# Patient Record
Sex: Female | Born: 1980 | Race: Black or African American | Hispanic: No | Marital: Single | Smoking: Current every day smoker
Health system: Southern US, Community
[De-identification: ages and names within clinical notes are randomized; demographics above are authoritative.]

---
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
## 2001-01-31 ENCOUNTER — Inpatient Hospital Stay (HOSPITAL_COMMUNITY): Admission: AD | Admit: 2001-01-31 | Discharge: 2001-01-31 | Payer: Self-pay | Admitting: Obstetrics and Gynecology

## 2001-02-20 ENCOUNTER — Inpatient Hospital Stay (HOSPITAL_COMMUNITY): Admission: AD | Admit: 2001-02-20 | Discharge: 2001-02-20 | Payer: Self-pay | Admitting: Obstetrics and Gynecology

## 2001-03-17 ENCOUNTER — Inpatient Hospital Stay (HOSPITAL_COMMUNITY): Admission: AD | Admit: 2001-03-17 | Discharge: 2001-03-17 | Payer: Self-pay | Admitting: Obstetrics and Gynecology

## 2001-03-31 ENCOUNTER — Observation Stay (HOSPITAL_COMMUNITY): Admission: AD | Admit: 2001-03-31 | Discharge: 2001-03-31 | Payer: Self-pay | Admitting: Obstetrics and Gynecology

## 2001-04-08 ENCOUNTER — Encounter (INDEPENDENT_AMBULATORY_CARE_PROVIDER_SITE_OTHER): Payer: Self-pay | Admitting: Specialist

## 2001-04-08 ENCOUNTER — Inpatient Hospital Stay (HOSPITAL_COMMUNITY): Admission: AD | Admit: 2001-04-08 | Discharge: 2001-04-11 | Payer: Self-pay | Admitting: Obstetrics and Gynecology

## 2001-05-21 ENCOUNTER — Other Ambulatory Visit: Admission: RE | Admit: 2001-05-21 | Discharge: 2001-05-21 | Payer: Self-pay | Admitting: Obstetrics and Gynecology

## 2001-06-28 ENCOUNTER — Emergency Department (HOSPITAL_COMMUNITY): Admission: EM | Admit: 2001-06-28 | Discharge: 2001-06-28 | Payer: Self-pay

## 2001-07-08 ENCOUNTER — Emergency Department (HOSPITAL_COMMUNITY): Admission: EM | Admit: 2001-07-08 | Discharge: 2001-07-08 | Payer: Self-pay | Admitting: Emergency Medicine

## 2001-07-08 ENCOUNTER — Encounter: Payer: Self-pay | Admitting: Emergency Medicine

## 2001-10-28 ENCOUNTER — Inpatient Hospital Stay (HOSPITAL_COMMUNITY): Admission: AD | Admit: 2001-10-28 | Discharge: 2001-10-28 | Payer: Self-pay | Admitting: *Deleted

## 2001-10-31 ENCOUNTER — Inpatient Hospital Stay (HOSPITAL_COMMUNITY): Admission: AD | Admit: 2001-10-31 | Discharge: 2001-10-31 | Payer: Self-pay | Admitting: *Deleted

## 2001-11-07 ENCOUNTER — Encounter: Admission: RE | Admit: 2001-11-07 | Discharge: 2001-11-07 | Payer: Self-pay | Admitting: *Deleted

## 2001-11-11 ENCOUNTER — Inpatient Hospital Stay (HOSPITAL_COMMUNITY): Admission: AD | Admit: 2001-11-11 | Discharge: 2001-11-11 | Payer: Self-pay | Admitting: Obstetrics and Gynecology

## 2001-11-14 ENCOUNTER — Encounter: Admission: RE | Admit: 2001-11-14 | Discharge: 2001-11-14 | Payer: Self-pay | Admitting: *Deleted

## 2001-11-19 ENCOUNTER — Encounter (INDEPENDENT_AMBULATORY_CARE_PROVIDER_SITE_OTHER): Payer: Self-pay

## 2001-11-19 ENCOUNTER — Inpatient Hospital Stay (HOSPITAL_COMMUNITY): Admission: AD | Admit: 2001-11-19 | Discharge: 2001-11-21 | Payer: Self-pay | Admitting: *Deleted

## 2002-08-13 ENCOUNTER — Encounter: Payer: Self-pay | Admitting: *Deleted

## 2002-08-13 ENCOUNTER — Emergency Department (HOSPITAL_COMMUNITY): Admission: EM | Admit: 2002-08-13 | Discharge: 2002-08-13 | Payer: Self-pay | Admitting: *Deleted

## 2002-12-16 ENCOUNTER — Inpatient Hospital Stay (HOSPITAL_COMMUNITY): Admission: AD | Admit: 2002-12-16 | Discharge: 2002-12-16 | Payer: Self-pay | Admitting: *Deleted

## 2002-12-16 ENCOUNTER — Encounter: Payer: Self-pay | Admitting: *Deleted

## 2003-02-04 ENCOUNTER — Encounter: Admission: RE | Admit: 2003-02-04 | Discharge: 2003-02-04 | Payer: Self-pay | Admitting: *Deleted

## 2003-02-05 ENCOUNTER — Encounter: Admission: RE | Admit: 2003-02-05 | Discharge: 2003-02-05 | Payer: Self-pay | Admitting: *Deleted

## 2003-02-11 ENCOUNTER — Ambulatory Visit (HOSPITAL_COMMUNITY): Admission: RE | Admit: 2003-02-11 | Discharge: 2003-02-11 | Payer: Self-pay | Admitting: *Deleted

## 2003-02-12 ENCOUNTER — Encounter: Admission: RE | Admit: 2003-02-12 | Discharge: 2003-02-12 | Payer: Self-pay | Admitting: Family Medicine

## 2003-02-23 ENCOUNTER — Inpatient Hospital Stay (HOSPITAL_COMMUNITY): Admission: AD | Admit: 2003-02-23 | Discharge: 2003-02-23 | Payer: Self-pay | Admitting: Obstetrics & Gynecology

## 2003-03-22 ENCOUNTER — Inpatient Hospital Stay (HOSPITAL_COMMUNITY): Admission: AD | Admit: 2003-03-22 | Discharge: 2003-03-24 | Payer: Self-pay | Admitting: Family Medicine

## 2003-04-21 ENCOUNTER — Encounter: Admission: RE | Admit: 2003-04-21 | Discharge: 2003-04-21 | Payer: Self-pay | Admitting: Obstetrics and Gynecology

## 2003-05-14 ENCOUNTER — Emergency Department (HOSPITAL_COMMUNITY): Admission: AD | Admit: 2003-05-14 | Discharge: 2003-05-14 | Payer: Self-pay | Admitting: Family Medicine

## 2004-03-21 ENCOUNTER — Emergency Department (HOSPITAL_COMMUNITY): Admission: EM | Admit: 2004-03-21 | Discharge: 2004-03-21 | Payer: Self-pay | Admitting: Emergency Medicine

## 2004-04-17 ENCOUNTER — Emergency Department (HOSPITAL_COMMUNITY): Admission: EM | Admit: 2004-04-17 | Discharge: 2004-04-17 | Payer: Self-pay | Admitting: Emergency Medicine

## 2004-04-26 ENCOUNTER — Emergency Department (HOSPITAL_COMMUNITY): Admission: EM | Admit: 2004-04-26 | Discharge: 2004-04-26 | Payer: Self-pay | Admitting: Emergency Medicine

## 2004-05-30 ENCOUNTER — Inpatient Hospital Stay (HOSPITAL_COMMUNITY): Admission: AD | Admit: 2004-05-30 | Discharge: 2004-05-30 | Payer: Self-pay | Admitting: Obstetrics & Gynecology

## 2004-06-23 ENCOUNTER — Ambulatory Visit: Payer: Self-pay | Admitting: Family Medicine

## 2004-07-07 ENCOUNTER — Ambulatory Visit: Payer: Self-pay | Admitting: Family Medicine

## 2004-08-02 ENCOUNTER — Inpatient Hospital Stay (HOSPITAL_COMMUNITY): Admission: AD | Admit: 2004-08-02 | Discharge: 2004-08-04 | Payer: Self-pay | Admitting: Family Medicine

## 2004-08-03 ENCOUNTER — Ambulatory Visit: Payer: Self-pay | Admitting: Obstetrics and Gynecology

## 2004-08-03 ENCOUNTER — Encounter (INDEPENDENT_AMBULATORY_CARE_PROVIDER_SITE_OTHER): Payer: Self-pay | Admitting: Specialist

## 2004-09-16 ENCOUNTER — Emergency Department (HOSPITAL_COMMUNITY): Admission: EM | Admit: 2004-09-16 | Discharge: 2004-09-16 | Payer: Self-pay | Admitting: Emergency Medicine

## 2005-02-05 ENCOUNTER — Emergency Department (HOSPITAL_COMMUNITY): Admission: EM | Admit: 2005-02-05 | Discharge: 2005-02-05 | Payer: Self-pay | Admitting: Emergency Medicine

## 2006-01-31 ENCOUNTER — Emergency Department (HOSPITAL_COMMUNITY): Admission: EM | Admit: 2006-01-31 | Discharge: 2006-01-31 | Payer: Self-pay | Admitting: Emergency Medicine

## 2006-04-01 ENCOUNTER — Emergency Department (HOSPITAL_COMMUNITY): Admission: EM | Admit: 2006-04-01 | Discharge: 2006-04-02 | Payer: Self-pay | Admitting: *Deleted

## 2006-12-03 ENCOUNTER — Emergency Department (HOSPITAL_COMMUNITY): Admission: EM | Admit: 2006-12-03 | Discharge: 2006-12-03 | Payer: Self-pay | Admitting: Emergency Medicine

## 2007-12-01 ENCOUNTER — Emergency Department (HOSPITAL_COMMUNITY): Admission: EM | Admit: 2007-12-01 | Discharge: 2007-12-01 | Payer: Self-pay | Admitting: Emergency Medicine

## 2008-05-06 ENCOUNTER — Emergency Department (HOSPITAL_COMMUNITY): Admission: EM | Admit: 2008-05-06 | Discharge: 2008-05-06 | Payer: Self-pay | Admitting: Emergency Medicine

## 2008-05-08 ENCOUNTER — Emergency Department (HOSPITAL_COMMUNITY): Admission: EM | Admit: 2008-05-08 | Discharge: 2008-05-08 | Payer: Self-pay | Admitting: Emergency Medicine

## 2008-08-06 ENCOUNTER — Emergency Department (HOSPITAL_COMMUNITY): Admission: EM | Admit: 2008-08-06 | Discharge: 2008-08-06 | Payer: Self-pay | Admitting: Emergency Medicine

## 2008-08-15 ENCOUNTER — Emergency Department (HOSPITAL_COMMUNITY): Admission: EM | Admit: 2008-08-15 | Discharge: 2008-08-15 | Payer: Self-pay | Admitting: Emergency Medicine

## 2009-01-05 ENCOUNTER — Emergency Department (HOSPITAL_COMMUNITY): Admission: EM | Admit: 2009-01-05 | Discharge: 2009-01-05 | Payer: Self-pay | Admitting: Emergency Medicine

## 2009-05-29 ENCOUNTER — Emergency Department (HOSPITAL_COMMUNITY): Admission: EM | Admit: 2009-05-29 | Discharge: 2009-05-29 | Payer: Self-pay | Admitting: Emergency Medicine

## 2009-11-27 ENCOUNTER — Ambulatory Visit: Payer: Self-pay | Admitting: Nurse Practitioner

## 2009-11-27 ENCOUNTER — Inpatient Hospital Stay (HOSPITAL_COMMUNITY): Admission: AD | Admit: 2009-11-27 | Discharge: 2009-11-27 | Payer: Self-pay | Admitting: Obstetrics & Gynecology
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
## 2010-08-28 LAB — URINE MICROSCOPIC-ADD ON

## 2010-08-28 LAB — URINALYSIS, ROUTINE W REFLEX MICROSCOPIC
Ketones, ur: 15 mg/dL — AB
Nitrite: POSITIVE — AB
Protein, ur: >300 mg/dL — AB
pH: 6.5 (ref 5.0–8.0)

## 2010-09-12 LAB — ETHANOL
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
## 2011-03-14 LAB — CULTURE, ROUTINE-ABSCESS

## 2013-07-07 ENCOUNTER — Emergency Department (HOSPITAL_COMMUNITY)
Admission: EM | Admit: 2013-07-07 | Discharge: 2013-07-07 | Disposition: A | Discharge status: Home / Self Care | Payer: Self-pay | Attending: Emergency Medicine | Admitting: Emergency Medicine

## 2013-07-07 ENCOUNTER — Encounter (HOSPITAL_COMMUNITY): Payer: Self-pay | Admitting: Emergency Medicine

## 2013-07-07 DIAGNOSIS — F17200 Nicotine dependence, unspecified, uncomplicated: Secondary | ICD-10-CM | POA: Insufficient documentation

## 2013-07-07 DIAGNOSIS — J020 Streptococcal pharyngitis: Secondary | ICD-10-CM | POA: Insufficient documentation

## 2013-07-07 DIAGNOSIS — IMO0001 Reserved for inherently not codable concepts without codable children: Secondary | ICD-10-CM | POA: Insufficient documentation

## 2013-07-07 MED ORDER — ACETAMINOPHEN 500 MG PO TABS: 500.0000 mg | ORAL_TABLET | Freq: Four times a day (QID) | ORAL | Status: AC | PRN

## 2013-07-07 MED ORDER — OXYCODONE-ACETAMINOPHEN 5-325 MG PO TABS
2.0000 | ORAL_TABLET | Freq: Once | ORAL | Status: AC
  Administered 2013-07-07: 2 via ORAL
  Filled 2013-07-07: qty 2

## 2013-07-07 MED ORDER — PENICILLIN G BENZATHINE 1200000 UNIT/2ML IM SUSP
1.2000 10*6.[IU] | Freq: Once | INTRAMUSCULAR | Status: AC
  Administered 2013-07-07: 1.2 10*6.[IU] via INTRAMUSCULAR
  Filled 2013-07-07: qty 2

## 2013-07-07 NOTE — Discharge Instructions (Signed)
You have been treated for strep throat. Take tylenol as needed for pain. Refer to attached documents for more information. Return to the ED with worsening or concerning symptoms.

## 2013-07-07 NOTE — ED Provider Notes (Signed)
CSN: [id number]     Arrival date & time 07/07/13  1950 History  This chart was scribed for non-physician practitioner, Emilia BeckKaitlyn Libby Goehring, PA-C working with Gavin PoundMichael Y. Oletta LamasGhim, MD by Greggory StallionKayla Andersen, ED scribe. This patient was seen in room TR08C/TR08C and the patient's care was started at 9:40 PM.   Chief Complaint  Patient presents with  . Sore Throat   The history is provided by the patient. No language interpreter was used.   HPI Comments: Colleen Hicks is a 33 y.o. female who presents to the Emergency Department complaining of gradual onset, constant sore throat and generalized body aches that started 3 days ago. Denies fever but has gotten intermittent chills. Pt has taken mucinex with no relief.   History reviewed. No pertinent past medical history. History reviewed. No pertinent past surgical history. History reviewed. No pertinent family history. History  Substance Use Topics  . Smoking status: Current Every Day Smoker  . Smokeless tobacco: Not on file  . Alcohol Use: Yes   OB History   Grav Para Term Preterm Abortions TAB SAB Ect Mult Living                 Review of Systems  Constitutional: Positive for chills. Negative for fever.  HENT: Positive for sore throat.   Musculoskeletal: Positive for myalgias.  All other systems reviewed and are negative.    Allergies  Review of patient's allergies indicates no known allergies.  Home Medications  No current outpatient prescriptions on file.  BP 117/81  Pulse 101  Temp(Src) 98.5 F (36.9 C) (Oral)  SpO2 100%  LMP 06/17/2013  Physical Exam  Nursing note and vitals reviewed. Constitutional: She is oriented to person, place, and time. She appears well-developed and well-nourished. No distress.  HENT:  Head: Normocephalic and atraumatic.  Mouth/Throat: Oropharyngeal exudate, posterior oropharyngeal edema and posterior oropharyngeal erythema present.  Eyes: EOM are normal.  Neck: Neck supple. No tracheal  deviation present.  Cardiovascular: Normal rate.   Pulmonary/Chest: Effort normal. No respiratory distress.  Musculoskeletal: Normal range of motion.  Neurological: She is alert and oriented to person, place, and time.  Skin: Skin is warm and dry.  Psychiatric: She has a normal mood and affect. Her behavior is normal.    ED Course  Procedures (including critical care time)  DIAGNOSTIC STUDIES: Oxygen Saturation is 100% on RA, normal by my interpretation.    COORDINATION OF CARE: 9:41 PM-Discussed treatment plan which includes an antibiotic with pt at bedside and pt agreed to plan.   Labs Review Labs Reviewed - No data to display Imaging Review No results found.  EKG Interpretation   None       MDM   1. Strep throat    Patient has strep throat based on clinical impression and will be treated with IM penicillin. Vitals stable and patient afebrile. Patient advised to return with worsening or concerning symptoms.   I personally performed the services described in this documentation, which was scribed in my presence. The recorded information has been reviewed and is accurate.   Emilia BeckKaitlyn Rima Blizzard, PA-C 07/08/13 1515

## 2013-07-07 NOTE — ED Notes (Signed)
C/O pain left throat/tonsil area x 3 days, worsening today.  Reports she has not eaten in two days due to pain.  Mucus membranes moist.  Rates pain 10/10

## 2013-07-07 NOTE — ED Notes (Signed)
Patient with sore throat since Friday.  Patient states that it is only on one side.

## 2013-07-08 NOTE — ED Provider Notes (Signed)
Medical screening examination/treatment/procedure(s) were performed by non-physician practitioner and as supervising physician I was immediately available for consultation/collaboration.  EKG Interpretation   None         Johanne Mcglade Y. Le Faulcon, MD 07/08/13 2102 

## 2015-11-08 ENCOUNTER — Emergency Department (HOSPITAL_COMMUNITY)
Admission: EM | Admit: 2015-11-08 | Discharge: 2015-11-08 | Disposition: A | Discharge status: Home / Self Care | Payer: Self-pay | Attending: Emergency Medicine | Admitting: Emergency Medicine

## 2015-11-08 ENCOUNTER — Encounter (HOSPITAL_COMMUNITY): Payer: Self-pay | Admitting: Nurse Practitioner

## 2015-11-08 DIAGNOSIS — Z3202 Encounter for pregnancy test, result negative: Secondary | ICD-10-CM | POA: Insufficient documentation

## 2015-11-08 DIAGNOSIS — F17200 Nicotine dependence, unspecified, uncomplicated: Secondary | ICD-10-CM | POA: Insufficient documentation

## 2015-11-08 DIAGNOSIS — N390 Urinary tract infection, site not specified: Secondary | ICD-10-CM | POA: Insufficient documentation

## 2015-11-08 LAB — POC URINE PREG, ED: PREG TEST UR: NEGATIVE

## 2015-11-08 LAB — URINALYSIS, ROUTINE W REFLEX MICROSCOPIC
Bilirubin Urine: NEGATIVE
Glucose, UA: NEGATIVE mg/dL
Ketones, ur: NEGATIVE mg/dL
Nitrite: POSITIVE — AB
PH: 6 (ref 5.0–8.0)
Protein, ur: 100 mg/dL — AB
SPECIFIC GRAVITY, URINE: 1.021 (ref 1.005–1.030)

## 2015-11-08 LAB — URINE MICROSCOPIC-ADD ON

## 2015-11-08 MED ORDER — CEPHALEXIN 500 MG PO CAPS: 500.0000 mg | ORAL_CAPSULE | Freq: Two times a day (BID) | ORAL | Status: AC

## 2015-11-08 NOTE — Discharge Instructions (Signed)
Please read and follow all provided instructions.  Your diagnoses today include:  1. UTI (lower urinary tract infection)    Tests performed today include:  Urine test - suggests that you have an infection in your bladder  Vital signs. See below for your results today.   Medications prescribed:   Take as prescribed   Home care instructions:  Follow any educational materials contained in this packet.  Follow-up instructions: Please follow-up with your primary care provider in 3 days if symptoms are not resolved for further evaluation of your symptoms.  Return instructions:   Please return to the Emergency Department if you experience worsening symptoms.   Return with fever, worsening pain, persistent vomiting, worsening pain in your back.   Please return if you have any other emergent concerns.  Additional Information:  Your vital signs today were: BP 134/100 mmHg   Pulse 74   Temp(Src) 98.3 F (36.8 C) (Oral)   Resp 18   SpO2 100% If your blood pressure (BP) was elevated above 135/85 this visit, please have this repeated by your doctor within one month. --------------

## 2015-11-08 NOTE — ED Notes (Signed)
She c/o 3 day history of dysuria, urinary frequency. Feels like when she had a UTI. She denies fevers, chills, n/v, abd pain

## 2015-11-08 NOTE — ED Provider Notes (Signed)
CSN: [id number]     Arrival date & time 11/08/15  1311 History   First MD Initiated Contact with Patient 11/08/15 1431     Chief Complaint  Patient presents with  . Urinary Frequency   (Consider location/radiation/quality/duration/timing/severity/associated sxs/prior Treatment) HPI 35 y.o. female  presents to the Emergency Department today complaining of urinary frequency, dysuria for the past 3 days. Denies fevers. No Cp/SOB/ABD pain. No headache. No N/V/D. No back pain. Does not endorse any pain currently. States that it feels similar to UTIs in the past. No other symptoms noted.  History reviewed. No pertinent past medical history. History reviewed. No pertinent past surgical history. History reviewed. No pertinent family history. Social History  Substance Use Topics  . Smoking status: Current Every Day Smoker  . Smokeless tobacco: None  . Alcohol Use: Yes   OB History    No data available     Review of Systems ROS reviewed and all are negative for acute change except as noted in the HPI.  Allergies  Review of patient's allergies indicates no known allergies.  Home Medications   Prior to Admission medications   Medication Sig Start Date End Date Taking? Authorizing Provider  acetaminophen (TYLENOL) 500 MG tablet Take 1 tablet (500 mg total) by mouth every 6 (six) hours as needed. 07/07/13   Kaitlyn Szekalski, PA-C   BP 134/100 mmHg  Pulse 74  Temp(Src) 98.3 F (36.8 C) (Oral)  Resp 18  SpO2 100%   Physical Exam  Constitutional: She is oriented to person, place, and time. She appears well-developed and well-nourished.  HENT:  Head: Normocephalic and atraumatic.  Eyes: EOM are normal. Pupils are equal, round, and reactive to light.  Neck: Normal range of motion. Neck supple.  Cardiovascular: Normal rate, regular rhythm, normal heart sounds and intact distal pulses.   No murmur heard. Pulmonary/Chest: Effort normal and breath sounds normal.  Abdominal: Soft. There  is no tenderness. There is no CVA tenderness.  Musculoskeletal: Normal range of motion.  Neurological: She is alert and oriented to person, place, and time.  Skin: Skin is warm and dry.  Psychiatric: She has a normal mood and affect. Her behavior is normal. Thought content normal.  Nursing note and vitals reviewed.  ED Course  Procedures (including critical care time) Labs Review Labs Reviewed  URINALYSIS, ROUTINE W REFLEX MICROSCOPIC (NOT [facility]) - Abnormal; Notable for the following:    APPearance TURBID (*)    Hgb urine dipstick MODERATE (*)    Protein, ur 100 (*)    Nitrite POSITIVE (*)    Leukocytes, UA LARGE (*)    All other components within normal limits  URINE MICROSCOPIC-ADD ON - Abnormal; Notable for the following:    Squamous Epithelial / LPF 0-5 (*)    Bacteria, UA FEW (*)    All other components within normal limits  POC URINE PREG, ED   Imaging Review No results found. I have personally reviewed and evaluated these images and lab results as part of my medical decision-making.   EKG Interpretation None      MDM  I have reviewed and evaluated the relevant laboratory values I have reviewed the relevant previous healthcare records.I obtained HPI from historian. Patient discussed with supervising physician  ED Course:  Assessment: Pt is a 34yF with Dysuria and increase in frequency x 3days. Pt has been diagnosed with a UTI based on UA. On exam, pt in NAD. VSS. Normotensive. Afebrile. Lungs CTA, Heart RRR, no CVA tenderness, and  denies N/V. Pt to be dc home with antibiotics and instructions to follow up with PCP if symptoms persist.  Disposition/Plan:  DC Home Additional Verbal discharge instructions given and discussed with patient.  Pt Instructed to f/u with PCP in the next week for evaluation and treatment of symptoms. Return precautions given Pt acknowledges and agrees with plan  Supervising Physician Leta Baptist, MD   Final diagnoses:  UTI  (lower urinary tract infection)     Audry Pili, PA-C 11/08/15 1449  Leta Baptist, MD 11/13/15 [phone]

## 2016-11-10 DEATH — deceased
